# Patient Record
Sex: Male | Born: 1990 | Race: Black or African American | Hispanic: No | Marital: Single | Smoking: Current some day smoker
Health system: Southern US, Community
[De-identification: ages and names within clinical notes are randomized; demographics above are authoritative.]

## PROBLEM LIST (undated history)

## (undated) DIAGNOSIS — J45909 Unspecified asthma, uncomplicated: Secondary | ICD-10-CM

---

## 2015-04-02 ENCOUNTER — Encounter (HOSPITAL_COMMUNITY): Payer: Self-pay | Admitting: Emergency Medicine

## 2015-04-02 ENCOUNTER — Emergency Department (HOSPITAL_COMMUNITY)
Admission: EM | Admit: 2015-04-02 | Discharge: 2015-04-03 | Disposition: A | Discharge status: Home / Self Care | Payer: Self-pay | Attending: Emergency Medicine | Admitting: Emergency Medicine

## 2015-04-02 ENCOUNTER — Emergency Department (HOSPITAL_COMMUNITY): Payer: Self-pay

## 2015-04-02 DIAGNOSIS — Y9389 Activity, other specified: Secondary | ICD-10-CM | POA: Insufficient documentation

## 2015-04-02 DIAGNOSIS — Z720 Tobacco use: Secondary | ICD-10-CM | POA: Insufficient documentation

## 2015-04-02 DIAGNOSIS — S62639B Displaced fracture of distal phalanx of unspecified finger, initial encounter for open fracture: Secondary | ICD-10-CM

## 2015-04-02 DIAGNOSIS — W230XXA Caught, crushed, jammed, or pinched between moving objects, initial encounter: Secondary | ICD-10-CM | POA: Insufficient documentation

## 2015-04-02 DIAGNOSIS — Y92810 Car as the place of occurrence of the external cause: Secondary | ICD-10-CM | POA: Insufficient documentation

## 2015-04-02 DIAGNOSIS — Y998 Other external cause status: Secondary | ICD-10-CM | POA: Insufficient documentation

## 2015-04-02 DIAGNOSIS — J45909 Unspecified asthma, uncomplicated: Secondary | ICD-10-CM | POA: Insufficient documentation

## 2015-04-02 DIAGNOSIS — S62664B Nondisplaced fracture of distal phalanx of right ring finger, initial encounter for open fracture: Secondary | ICD-10-CM | POA: Insufficient documentation

## 2015-04-02 HISTORY — DX: Unspecified asthma, uncomplicated: J45.909

## 2015-04-02 NOTE — ED Notes (Signed)
Pt slammed R ring finger in car door. Pt with laceration across nail bed.

## 2015-04-02 NOTE — ED Provider Notes (Signed)
CSN: [id number]     Arrival date & time 04/02/15  2259 History   First MD Initiated Contact with Patient 04/02/15 2327     Chief Complaint  Patient presents with  . Finger Injury     (Consider location/radiation/quality/duration/timing/severity/associated sxs/prior Treatment) HPI Comments: Patient is a 24 year old male with no past medical history who presents with right ring finger pain that started after slamming his finger in a car door. The pain started immediately and is described as throbbing. Palpation makes the pain worse. No alleviating factors. No other injury. Patient reports associated laceration over the nail. No other associated symptoms.    Past Medical History  Diagnosis Date  . Asthma    History reviewed. No pertinent past surgical history. No family history on file. History  Substance Use Topics  . Smoking status: Current Some Day Smoker  . Smokeless tobacco: Not on file  . Alcohol Use: Yes    Review of Systems  Musculoskeletal: Positive for arthralgias.  Skin: Positive for wound.  All other systems reviewed and are negative.     Allergies  Review of patient's allergies indicates no known allergies.  Home Medications   Prior to Admission medications   Not on File   BP 136/74 mmHg  Pulse 64  Temp(Src) 99 F (37.2 C)  Resp 18  Ht  (1.676 m)  Wt 172 lb (78.019 kg)  BMI 27.77 kg/m2  SpO2 97% Physical Exam  Constitutional: He is oriented to person, place, and time. He appears well-developed and well-nourished. No distress.  HENT:  Head: Normocephalic and atraumatic.  Eyes: Conjunctivae and EOM are normal.  Neck: Normal range of motion.  Cardiovascular: Normal rate and regular rhythm.  Exam reveals no gallop and no friction rub.   No murmur heard. Pulmonary/Chest: Effort normal and breath sounds normal. He has no wheezes. He has no rales. He exhibits no tenderness.  Abdominal: Soft. There is no tenderness.  Musculoskeletal: Normal range of  motion.  Full ROM of right ring finger. No obvious deformity. Laceration through the nail with bleeding controlled.   Neurological: He is alert and oriented to person, place, and time. Coordination normal.  Speech is goal-oriented. Moves limbs without ataxia.   Skin: Skin is warm and dry.  Psychiatric: He has a normal mood and affect. His behavior is normal.  Nursing note and vitals reviewed.   ED Course  NAIL REMOVAL Date/Time: 04/03/2015 12:56 AM Performed by: Emilia Beck Authorized by: Emilia Beck Consent: Verbal consent obtained. Risks and benefits: risks, benefits and alternatives were discussed Consent given by: patient Patient understanding: patient states understanding of the procedure being performed Patient consent: the patient's understanding of the procedure matches consent given Procedure consent: procedure consent matches procedure scheduled Patient identity confirmed: verbally with patient Time out: Immediately prior to procedure a "time out" was called to verify the correct patient, procedure, equipment, support staff and site/side marked as required. Location: right hand Location details: right ring finger Anesthesia: digital block Local anesthetic: lidocaine 1% without epinephrine Anesthetic total: 2 ml Patient sedated: no Preparation: skin prepped with alcohol Amount removed: 1/2 Nail bed sutured: no Nail matrix removed: none Dressing: petrolatum-impregnated gauze Patient tolerance: Patient tolerated the procedure well with no immediate complications  NERVE BLOCK Date/Time: 04/03/2015 12:58 AM Performed by: Emilia Beck Authorized by: Emilia Beck Consent: Verbal consent obtained. Risks and benefits: risks, benefits and alternatives were discussed Consent given by: patient Patient understanding: patient states understanding of the procedure being performed Patient  consent: the patient's understanding of the procedure matches  consent given Patient identity confirmed: verbally with patient Time out: Immediately prior to procedure a "time out" was called to verify the correct patient, procedure, equipment, support staff and site/side marked as required. Indications: pain relief Body area: upper extremity Nerve: digital Laterality: right Patient sedated: no Patient position: sitting Needle gauge: 25 G Location technique: anatomical landmarks Local anesthetic: lidocaine 1% without epinephrine Anesthetic total: 2 ml Outcome: pain improved Patient tolerance: Patient tolerated the procedure well with no immediate complications   (including critical care time) Labs Review Labs Reviewed - No data to display  SPLINT APPLICATION Date/Time: 12:56 AM Authorized by: Emilia BeckKaitlyn Keavon Sensing Consent: Verbal consent obtained. Risks and benefits: risks, benefits and alternatives were discussed Consent given by: patient Splint applied by: nurse Location details: right ring finger Splint type: static finger splint Supplies used: static finger splint Post-procedure: The splinted body part was neurovascularly unchanged following the procedure. Patient tolerance: Patient tolerated the procedure well with no immediate complications.     Imaging Review Dg Finger Ring Right  04/03/2015   CLINICAL DATA:  Closed ring finger in car door, distal phalanx pain.  EXAM: RIGHT RING FINGER 2+V  COMPARISON:  None.  FINDINGS: Mildly acute comminuted fourth distal phalanx tuft fracture without intra-articular extension, and alignment. No dislocation. No destructive bony lesions. Ungal irregularity may reflect acute injury, recommend direct inspection. No subcutaneous gas radiopaque foreign bodies.  IMPRESSION: Mildly acute comminuted nondisplaced fourth distal phalanx tuft fracture without dislocation.   Electronically Signed   By: Awilda Metroourtnay  Bloomer   On: 04/03/2015 00:47     EKG Interpretation None      MDM   Final diagnoses:  Open  fracture of tuft of distal phalanx of finger, initial encounter    11:38 PM Xray of finger pending. Patient will have nail removed. No other injury.  12:59 AM Patient's xray shows comminuted distal tuft fracture. Patient's nail removed. Patient will have vasoline gauze dressing and static finger splint. Patient will have keflex and tramadol for pain and infection prophylaxis. Patient will have recommended follow up with Dr. Mina MarbleWeingold.      Emilia BeckKaitlyn Marquice Uddin, PA-C 04/03/15 0101  Purvis SheffieldForrest Harrison, MD 04/04/15 2146

## 2015-04-03 MED ORDER — TRAMADOL HCL 50 MG PO TABS: 50.0000 mg | ORAL_TABLET | Freq: Four times a day (QID) | ORAL | Status: AC | PRN

## 2015-04-03 MED ORDER — CEPHALEXIN 250 MG PO CAPS
500.0000 mg | ORAL_CAPSULE | Freq: Once | ORAL | Status: AC
  Administered 2015-04-03: 500 mg via ORAL
  Filled 2015-04-03: qty 2

## 2015-04-03 MED ORDER — LIDOCAINE HCL (PF) 1 % IJ SOLN
5.0000 mL | Freq: Once | INTRAMUSCULAR | Status: AC
  Administered 2015-04-03: 5 mL via INTRADERMAL
  Filled 2015-04-03: qty 5

## 2015-04-03 MED ORDER — LIDOCAINE-EPINEPHRINE 1 %-1:100000 IJ SOLN
10.0000 mL | Freq: Once | INTRAMUSCULAR | Status: DC
  Filled 2015-04-03: qty 1

## 2015-04-03 MED ORDER — CEPHALEXIN 500 MG PO CAPS: 500.0000 mg | ORAL_CAPSULE | Freq: Four times a day (QID) | ORAL | Status: AC

## 2015-04-03 NOTE — ED Notes (Signed)
Pt c/o laceration through nail bed of R ring finger after slamming finger in a car door. Bleeding controlled at this time

## 2015-04-03 NOTE — Discharge Instructions (Signed)
Keep splint intact and take Keflex as directed until gone. Follow up with Dr. Mina MarbleWeingold for further evaluation. Refer to attached documents for more information.

## 2016-03-26 IMAGING — CR DG FINGER RING 2+V*R*
3 series · 3 of 3 positions shown · non-contrast
Comparison: None.

CLINICAL DATA: Closed ring finger in car door, distal phalanx pain.

EXAM:
RIGHT RING FINGER 2+V

[x finger pa right]
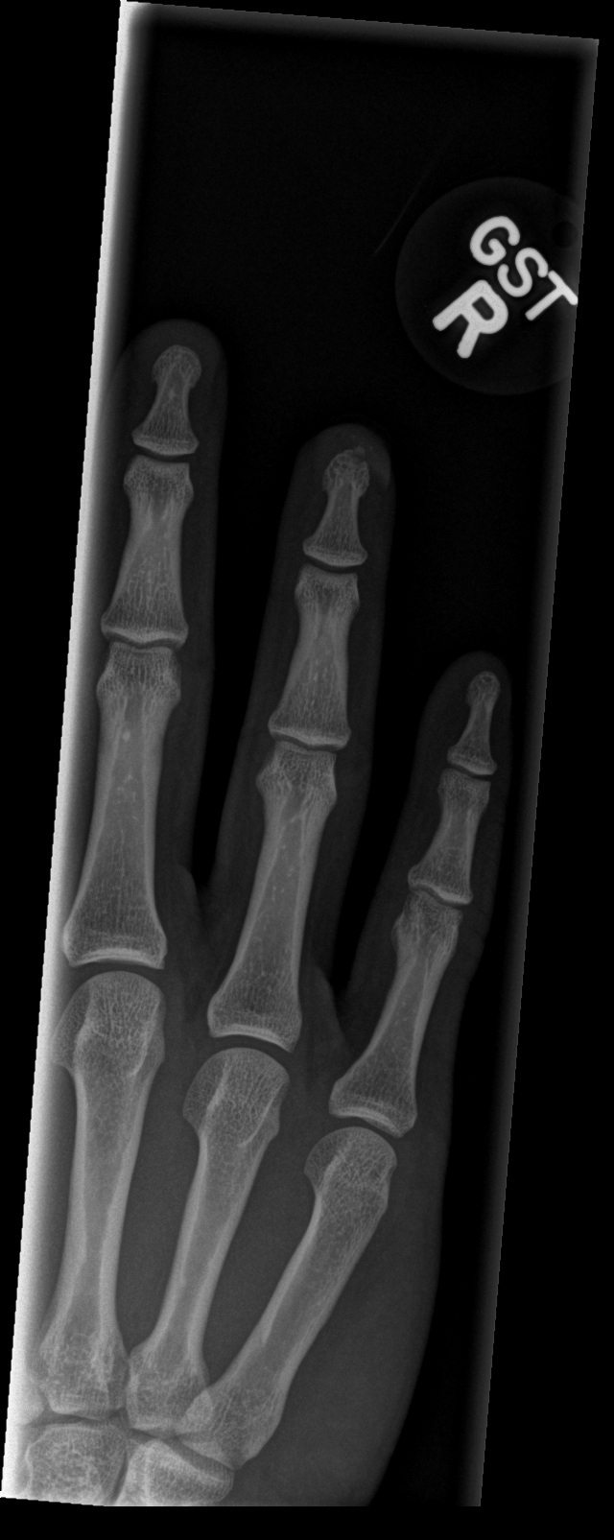

[x finger obl right]
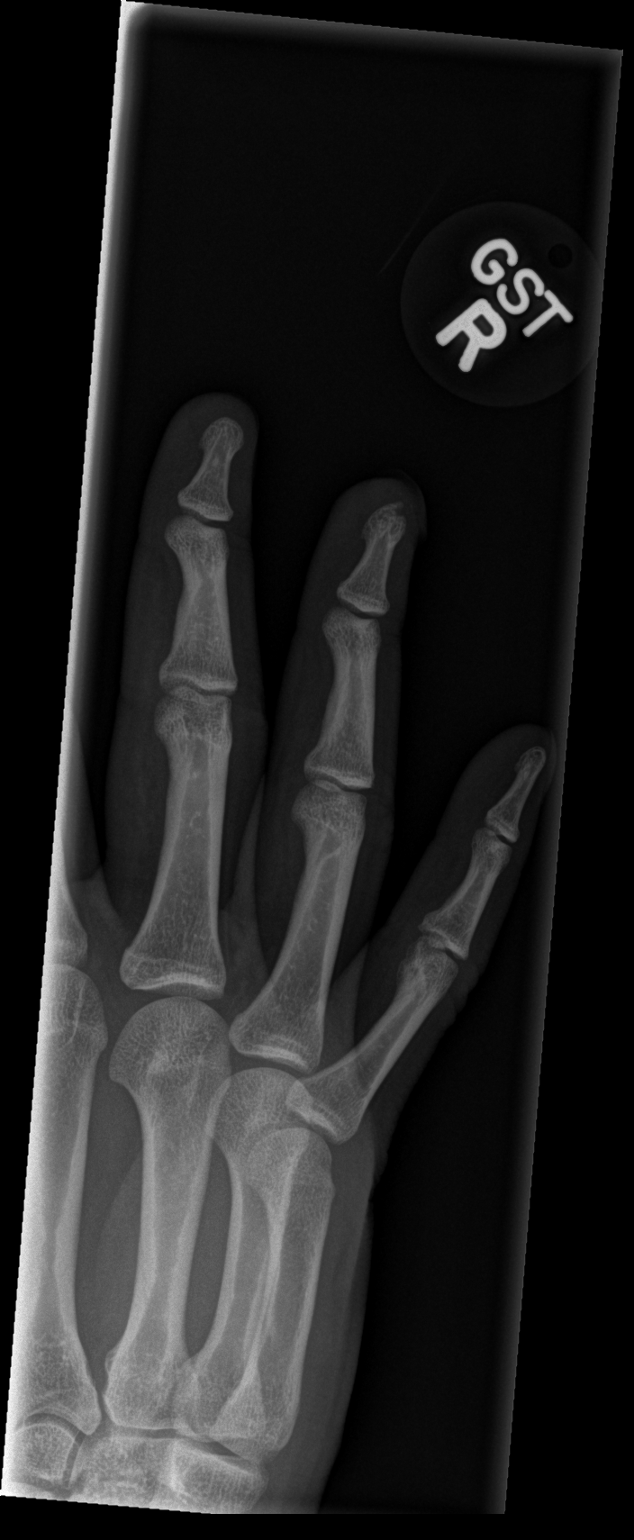

[x finger lat right]
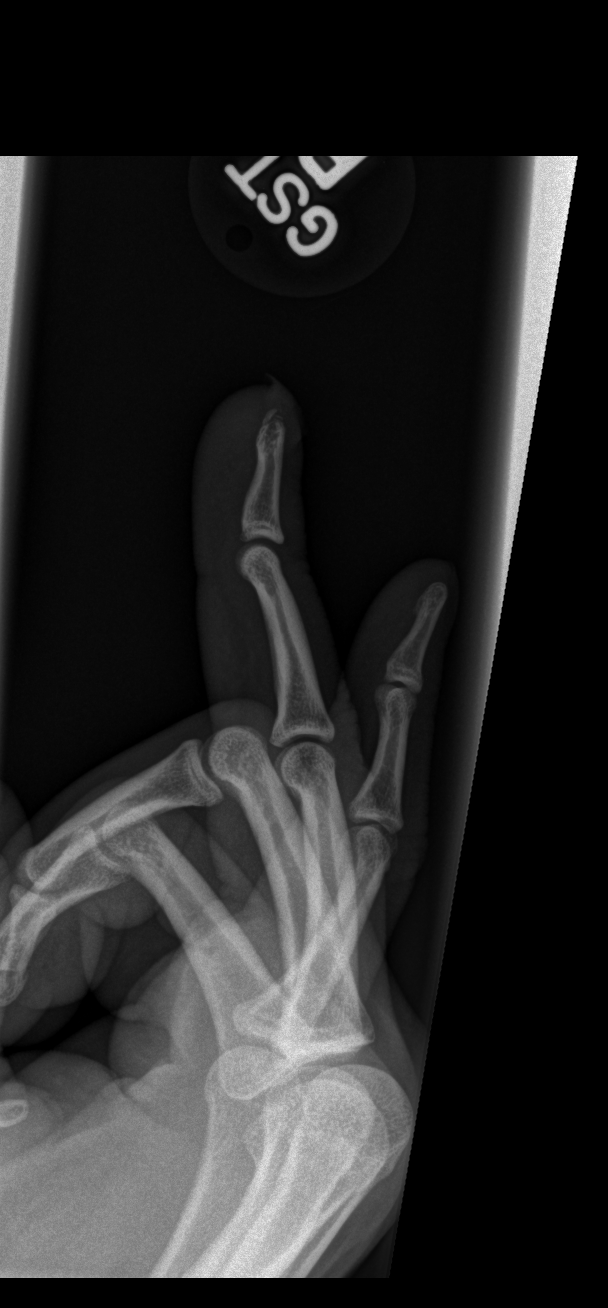

[3 of 3 positions shown; findings below may reference images not displayed]

FINDINGS: Mildly acute comminuted fourth distal phalanx tuft fracture without
intra-articular extension, and alignment. No dislocation. No
destructive bony lesions. Ungal irregularity may reflect acute
injury, recommend direct inspection. No subcutaneous gas radiopaque
foreign bodies.
IMPRESSION: Mildly acute comminuted nondisplaced fourth distal phalanx tuft
fracture without dislocation.

By: Kimiko Hinkson
